# Patient Record
Sex: Female | Born: 1980 | Race: White | Hispanic: No | Marital: Single | Smoking: Never smoker
Health system: Southern US, Community
[De-identification: ages and names within clinical notes are randomized; demographics above are authoritative.]

## PROBLEM LIST (undated history)

## (undated) DIAGNOSIS — S92401A Displaced unspecified fracture of right great toe, initial encounter for closed fracture: Secondary | ICD-10-CM

## (undated) DIAGNOSIS — E039 Hypothyroidism, unspecified: Secondary | ICD-10-CM

## (undated) HISTORY — DX: Displaced unspecified fracture of right great toe, initial encounter for closed fracture: S92.401A

## (undated) HISTORY — DX: Hypothyroidism, unspecified: E03.9

---

## 2010-09-24 ENCOUNTER — Observation Stay (HOSPITAL_COMMUNITY)
Admission: EM | Admit: 2010-09-24 | Discharge: 2010-09-25 | Payer: Self-pay | Source: Home / Self Care | Attending: Orthopedic Surgery | Admitting: Orthopedic Surgery

## 2010-09-25 ENCOUNTER — Encounter: Payer: Self-pay | Admitting: Orthopedic Surgery

## 2010-10-04 LAB — DIFFERENTIAL
Basophils Absolute: 0 10*3/uL (ref 0.0–0.1)
Basophils Absolute: 0 10*3/uL (ref 0.0–0.1)
Basophils Relative: 0 % (ref 0–1)
Basophils Relative: 0 % (ref 0–1)
Eosinophils Absolute: 0.1 10*3/uL (ref 0.0–0.7)
Eosinophils Absolute: 0 10*3/uL (ref 0.0–0.7)
Eosinophils Relative: 0 % (ref 0–5)
Eosinophils Relative: 0 % (ref 0–5)
Lymphocytes Relative: 9 % — ABNORMAL LOW (ref 12–46)
Lymphocytes Relative: 14 % (ref 12–46)
Lymphs Abs: 1.4 10*3/uL (ref 0.7–4.0)
Lymphs Abs: 1.6 10*3/uL (ref 0.7–4.0)
Monocytes Absolute: 0.8 10*3/uL (ref 0.1–1.0)
Monocytes Absolute: 0.6 10*3/uL (ref 0.1–1.0)
Monocytes Relative: 5 % (ref 3–12)
Monocytes Relative: 5 % (ref 3–12)
Neutro Abs: 14.1 10*3/uL — ABNORMAL HIGH (ref 1.7–7.7)
Neutro Abs: 9.2 10*3/uL — ABNORMAL HIGH (ref 1.7–7.7)
Neutrophils Relative %: 86 % — ABNORMAL HIGH (ref 43–77)
Neutrophils Relative %: 81 % — ABNORMAL HIGH (ref 43–77)

## 2010-10-04 LAB — COMPREHENSIVE METABOLIC PANEL
ALT: 60 U/L — ABNORMAL HIGH (ref 0–35)
AST: 56 U/L — ABNORMAL HIGH (ref 0–37)
Albumin: 3.9 g/dL (ref 3.5–5.2)
Alkaline Phosphatase: 40 U/L (ref 39–117)
BUN: 11 mg/dL (ref 6–23)
CO2: 27 mEq/L (ref 19–32)
Calcium: 8.9 mg/dL (ref 8.4–10.5)
Chloride: 99 mEq/L (ref 96–112)
Creatinine, Ser: 0.68 mg/dL (ref 0.4–1.2)
GFR calc Af Amer: >60 mL/min (ref >60)
GFR calc non Af Amer: >60 mL/min (ref >60)
Glucose, Bld: 116 mg/dL — ABNORMAL HIGH (ref 70–99)
Potassium: 4.5 mEq/L (ref 3.5–5.1)
Sodium: 134 mEq/L — ABNORMAL LOW (ref 135–145)
Total Bilirubin: 0.8 mg/dL (ref 0.3–1.2)
Total Protein: 6.5 g/dL (ref 6.0–8.3)

## 2010-10-04 LAB — CBC
HCT: 38.2 % (ref 36.0–46.0)
HCT: 34.7 % — ABNORMAL LOW (ref 36.0–46.0)
Hemoglobin: 13.9 g/dL (ref 12.0–15.0)
Hemoglobin: 12.3 g/dL (ref 12.0–15.0)
MCH: 32.2 pg (ref 26.0–34.0)
MCH: 31.8 pg (ref 26.0–34.0)
MCHC: 36.4 g/dL — ABNORMAL HIGH (ref 30.0–36.0)
MCHC: 35.4 g/dL (ref 30.0–36.0)
MCV: 88.4 fL (ref 78.0–100.0)
MCV: 89.7 fL (ref 78.0–100.0)
Platelets: 248 10*3/uL (ref 150–400)
Platelets: 231 10*3/uL (ref 150–400)
RBC: 4.32 MIL/uL (ref 3.87–5.11)
RBC: 3.87 MIL/uL (ref 3.87–5.11)
RDW: 13.2 % (ref 11.5–15.5)
RDW: 13.5 % (ref 11.5–15.5)
WBC: 16.4 10*3/uL — ABNORMAL HIGH (ref 4.0–10.5)
WBC: 11.4 10*3/uL — ABNORMAL HIGH (ref 4.0–10.5)

## 2010-10-04 LAB — BASIC METABOLIC PANEL
BUN: 16 mg/dL (ref 6–23)
CO2: 27 mEq/L (ref 19–32)
Calcium: 9.6 mg/dL (ref 8.4–10.5)
Chloride: 100 mEq/L (ref 96–112)
Creatinine, Ser: 0.75 mg/dL (ref 0.4–1.2)
GFR calc Af Amer: >60 mL/min (ref >60)
GFR calc non Af Amer: >60 mL/min (ref >60)
Glucose, Bld: 138 mg/dL — ABNORMAL HIGH (ref 70–99)
Potassium: 4.1 mEq/L (ref 3.5–5.1)
Sodium: 136 mEq/L (ref 135–145)

## 2010-10-14 NOTE — Consult Note (Addendum)
  [name], [name]              ACCOUNT NO.:  [id number]  MEDICAL RECORD NO.:  [id number]          PATIENT TYPE:  OBV  LOCATION:  A310                          FACILITY:  [facility]  PHYSICIAN:  Vickki Hearing, M.D.DATE OF BIRTH:  [birth year]  DATE OF CONSULTATION: DATE OF DISCHARGE:  09/25/2010                                CONSULTATION   REASON FOR CONSULTATION:  Right rib fracture, #4 and 5.  HISTORY:  As noted in the medical record.  The patient basically is 30 years old, fell off a horse, had a small pneumothorax, fractured fourth and fifth rib, had a chest x-ray and CT scan which showed minimal pneumothorax, had a followup chest x-ray which showed no increase in the pneumothorax.  PHYSICAL EXAMINATION:  GENERAL:  Well-developed, well-nourished female, grooming, hygiene normal, slightly large body habitus. VITAL SIGNS:  Stable. HEENT:  She had some tenderness along the medial border of the scapula on the right. CHEST:  No chest wall tenderness anteriorly. NEUROLOGIC:  No neurovascular disturbance. SKIN:  Intact. NECK:  No lymphadenopathy in the cervical area. EXTREMITIES:  Seem to be functioning fine in terms of range of motion, strength, stability and alignment.  RADIOGRAPH:  As described  The patient will follow up with [place] in 2 weeks.  She is on Vicodin for pain.  She is advised not to do any strenuous activity for 6 weeks.     Vickki Hearing, M.D.     SEH/MEDQ  D:  09/25/2010  T:  09/26/2010  Job:  [id number]  cc:   Barbaraann Barthel, M.D. Fax: [phone]  Electronically Signed by Fuller Canada M.D. on 10/14/2010 12:44:56 PM

## 2010-10-18 ENCOUNTER — Ambulatory Visit
Admission: RE | Admit: 2010-10-18 | Discharge: 2010-10-18 | Payer: Self-pay | Source: Home / Self Care | Attending: Orthopedic Surgery | Admitting: Orthopedic Surgery

## 2010-10-18 DIAGNOSIS — S2249XA Multiple fractures of ribs, unspecified side, initial encounter for closed fracture: Secondary | ICD-10-CM | POA: Insufficient documentation

## 2010-10-21 NOTE — Consult Note (Signed)
Summary: Hospital consult RT rib fx  Hospital consult RT rib fx   Imported By: Cammie Sickle 10/01/2010 16:18:09  _____________________________________________________________________  External Attachment:    Type:   Image     Comment:   External Document

## 2010-10-21 NOTE — Letter (Signed)
Summary: Discharge Summary  Discharge Summary   Imported By: Cammie Sickle 10/05/2010 20:16:38  _____________________________________________________________________  External Attachment:    Type:   Image     Comment:   External Document

## 2010-10-25 NOTE — Discharge Summary (Signed)
  Annette Kelly, Annette Kelly              ACCOUNT NO.:  [id number]  MEDICAL RECORD NO.:  [id number]          PATIENT TYPE:  OBV  LOCATION:  A310                          FACILITY:  [facility]  PHYSICIAN:  Barbaraann Barthel, M.D. DATE OF BIRTH:  Apr 14, 1981  DATE OF ADMISSION:  09/24/2010 DATE OF DISCHARGE:  01/07/2012LH                              DISCHARGE SUMMARY   DIAGNOSIS:  Blunt trauma with right pneumothorax and fractured right fourth and fifth ribs.  NOTE:  This is a 30 year old white female who was riding a horse and fell off of her horse and came into the emergency room when she had chest pain.  She was seen and found to have a very minimal less than 5% pneumothorax on the right side.  She had fractured right fourth and fifth ribs.  Her CT scans of her abdomen, pelvis and chest were otherwise unremarkable.  The patient has remained hemodynamically stable and without any focalizing changes.  Her lab work has also remained stable.  Her white count is 11.4 with an H and H of 12.3 and 34.7 and her metabolic 7 was within normal limits.  She was observed overnight, obtained an orthopedic consult.  We will follow up with her and see if perhaps she might need some rib blocks for analgesia later on or other followup in that regard.  She is being discharged with the TENS unit and on Vicodin for pain and she is told to come to the emergency room if there are any acute respiratory changes.  Other discharge instructions include that she is definitely to do no physical activity for 6-8 weeks and no driving, no sexual activity, no lifting greater than 5 pounds. Otherwise, no restrictions as to diet and she is told to increase her activity slowly.  I will follow up with her in 2 week's time along with the orthopedics service and again she is very compliant patient and we will go to the emergency room should there be any acute changes.     Barbaraann Barthel, M.D.     WB/MEDQ  D:  09/25/2010   T:  09/26/2010  Job:  [id number]  cc:   Vickki Hearing, M.D. Fax: [phone]  Electronically Signed by Barbaraann Barthel M.D. on 10/25/2010 11:09:32 AM

## 2010-10-25 NOTE — Consult Note (Signed)
NAMESAJA, BARTOLINI              ACCOUNT NO.:  [id number]  MEDICAL RECORD NO.:  [id number]          PATIENT TYPE:  OBV  LOCATION:  A310                          FACILITY:  [facility]  PHYSICIAN:  Barbaraann Barthel, M.D. DATE OF BIRTH:  [birth year]  DATE OF CONSULTATION:  09/24/2010 DATE OF DISCHARGE:                                CONSULTATION   NOTE:  Surgery was asked to see this 30 year old white female who fell from her horse this evening.  She works as a Paediatric nurse, and she was wearing a helmet, and she was thrown from her horse.  There was no loss of consciousness, however, when she fell the wind knocked out of her, and she had some chest pain and came to the emergency room where she was noted to have fractures of her fourth and fifth rib on CT scan.  These were not seen by me on chest x-ray.  However, she had fractures of her fourth and fifth rib noted, but I did not see any pneumothorax on the plain chest film; however, she had a minimal 5% pneumothorax seen on CT scan.  No other abnormalities were encountered there, minimal contusion. Abdomen and pelvis CT is completely normal.  The patient is very stable except when she tries to move her right side.  She has had no hypotensive episodes or any other abnormalities noted.  PHYSICAL EXAMINATION:  VITAL SIGNS:  She has a temperature of 97.8.  Her blood pressure is 104/61.  Her pulse rate is 80, respirations 16, and she has had O2 sats of 96-100% on room air.  She is 5 feet 4 inches. Weighs 200 pounds. HEENT:  Head:  Normocephalic.  Eyes:  Extraocular movements are intact. Pupils were round and react to light and accommodation.  Nose and oral mucosa:  Moist. NECK:  Cylindrical.  There is no neck pain.  No jugular vein distention, thyromegaly, or tracheal deviation or bruits auscultated. CHEST:  The patient is tender over the fourth and fifth ribs on the right side.  Her lungs are clear, however. HEART:  Rate is regular without  murmurs. ABDOMEN:  Completely soft.  Bowel sounds are normoactive.  The patient has no femoral or inguinal hernias. EXTREMITIES:  She is moving all extremities well. RECTAL:  I did not do rectal examination on this patient, as she is rather sore to move and there is clinically absolutely no abdominal pain.  LABORATORY DATA:  CT scan of chest and abdomen and pelvis as mentioned above.  Her white count is 16,000 and 16.4 with an H and H of 13.9 and 38.2, platelet count is 248,000.  There is 86% neutrophiles.  Her metabolic parameters within normal limits.  BUN is 16 with a creatinine of 0.75, blood sugar is 138.  Urinalysis is pending.  REVIEW OF SYSTEMS:  CARDIORESPIRATORY SYSTEM:  As mentioned above, 5% pneumothorax with some contusion of the right lung and fractures of the fourth and fifth ribs.  She is a nonsmoker.  ENDOCRINE SYSTEM:  No history of diabetes or thyroid disease.  GU SYSTEM:  No history of dysuria or nephrolithiasis.  NEURO SYSTEM:  Intact.  OB/GYN HISTORY:  She has irregular menses.  Her last menstrual period was approximately 30 days ago, and she has no family history of carcinoma of the breast.  She is a gravida 0, para 0, abortus 0 female who is trying to obtain pregnancy with her husband.  IMPRESSION:  Therefore, contusions of right lung with fracture of the fourth and fifth ribs, minimal right pneumothorax.  The patient has no other localizing symptoms.  PLAN:  This patient will be admitted.  We will obtain a chest x-ray in the morning.  Obtain an Orthopedic consult in the morning as well.  We will control her pain with PCA pump tonight, and I will admit her and observe her and should she require a chest tube, I will place it here.     Barbaraann Barthel, M.D.     WB/MEDQ  D:  09/24/2010  T:  09/25/2010  Job:  [id number]  Electronically Signed by Barbaraann Barthel M.D. on 10/25/2010 11:09:25 AM

## 2010-10-27 NOTE — Assessment & Plan Note (Signed)
Summary: HOSP FOL/UP/FX RT RIB#4,5 PER DR HARRISON CONSULT/BCBS/CAF   Visit Type:  Follow-up  CC:  right rib fracture.  History of Present Illness: I saw Annette Kelly in the office today for a followup visit.  She is a 29 years old woman with the complaint of:  right rib fracture, 4 and 5  CONSULT FOLLOW UP   DOI 09-24-10.  Xrays done at [facility] on 09-25-10.  Medications: none.  Complaints: She is feeling much better.  NO BREATHING ISSUES   Allergies (verified): No Known Drug Allergies   Impression & Recommendations:  Problem # 1:  CLOSED FRACTURE OF TWO RIBS (ICD-807.02) Assessment Improved  SEEMS TO BE DOING VERY WELL. GRADUALLY RETURNING TO NORMAL ACTIVITY.   Orders: Est. Patient Level II (16109)  Patient Instructions: 1)  Gradually return to normal activity  2)  use pain as a guide  3)  If any breathing problems occur call [phone]  4)  Please schedule a follow-up appointment as needed.   Orders Added: 1)  Est. Patient Level II [60454]

## 2012-07-12 IMAGING — CR DG CHEST 2V
2 series · 2 of 2 positions shown · non-contrast
Comparison: Chest x-ray and chest CT dated 09/24/2010

CLINICAL DATA: Right pneumothorax.  The right rib fractures.

CHEST - 2 VIEW

[view not recorded (1 of 2)]
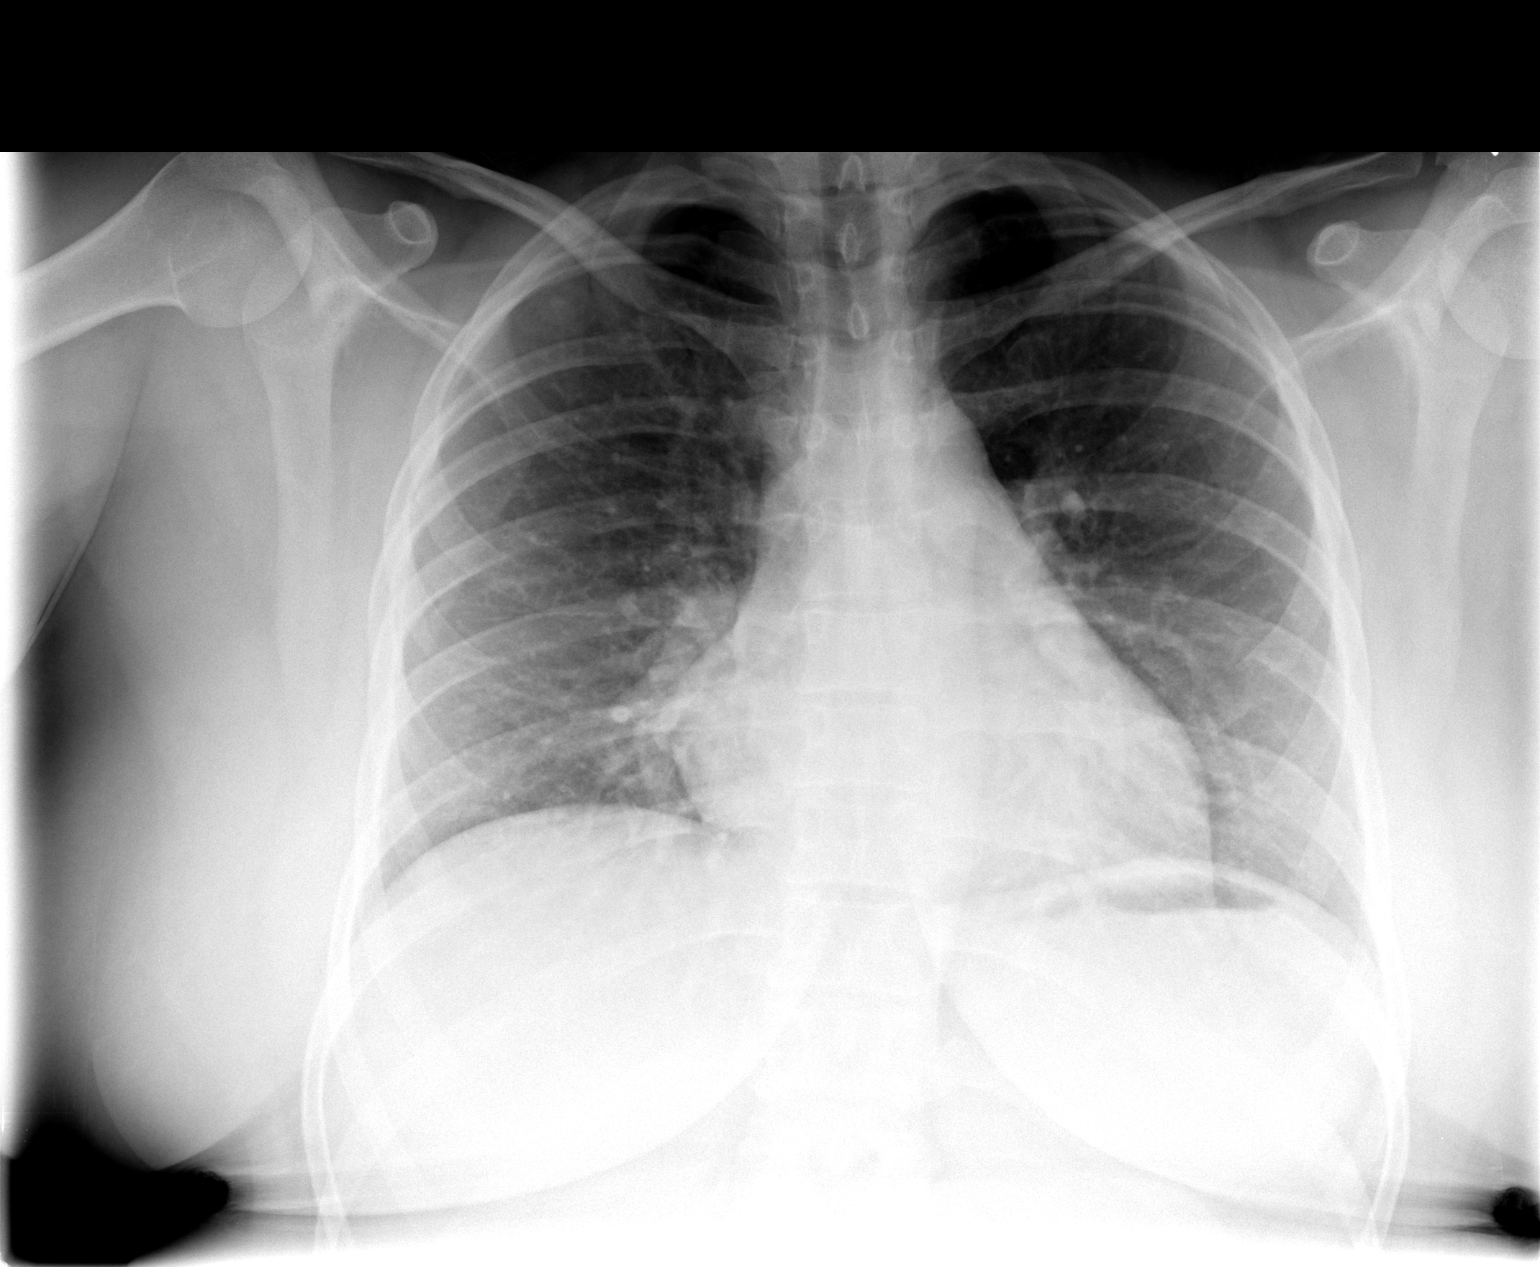

[view not recorded (2 of 2)]
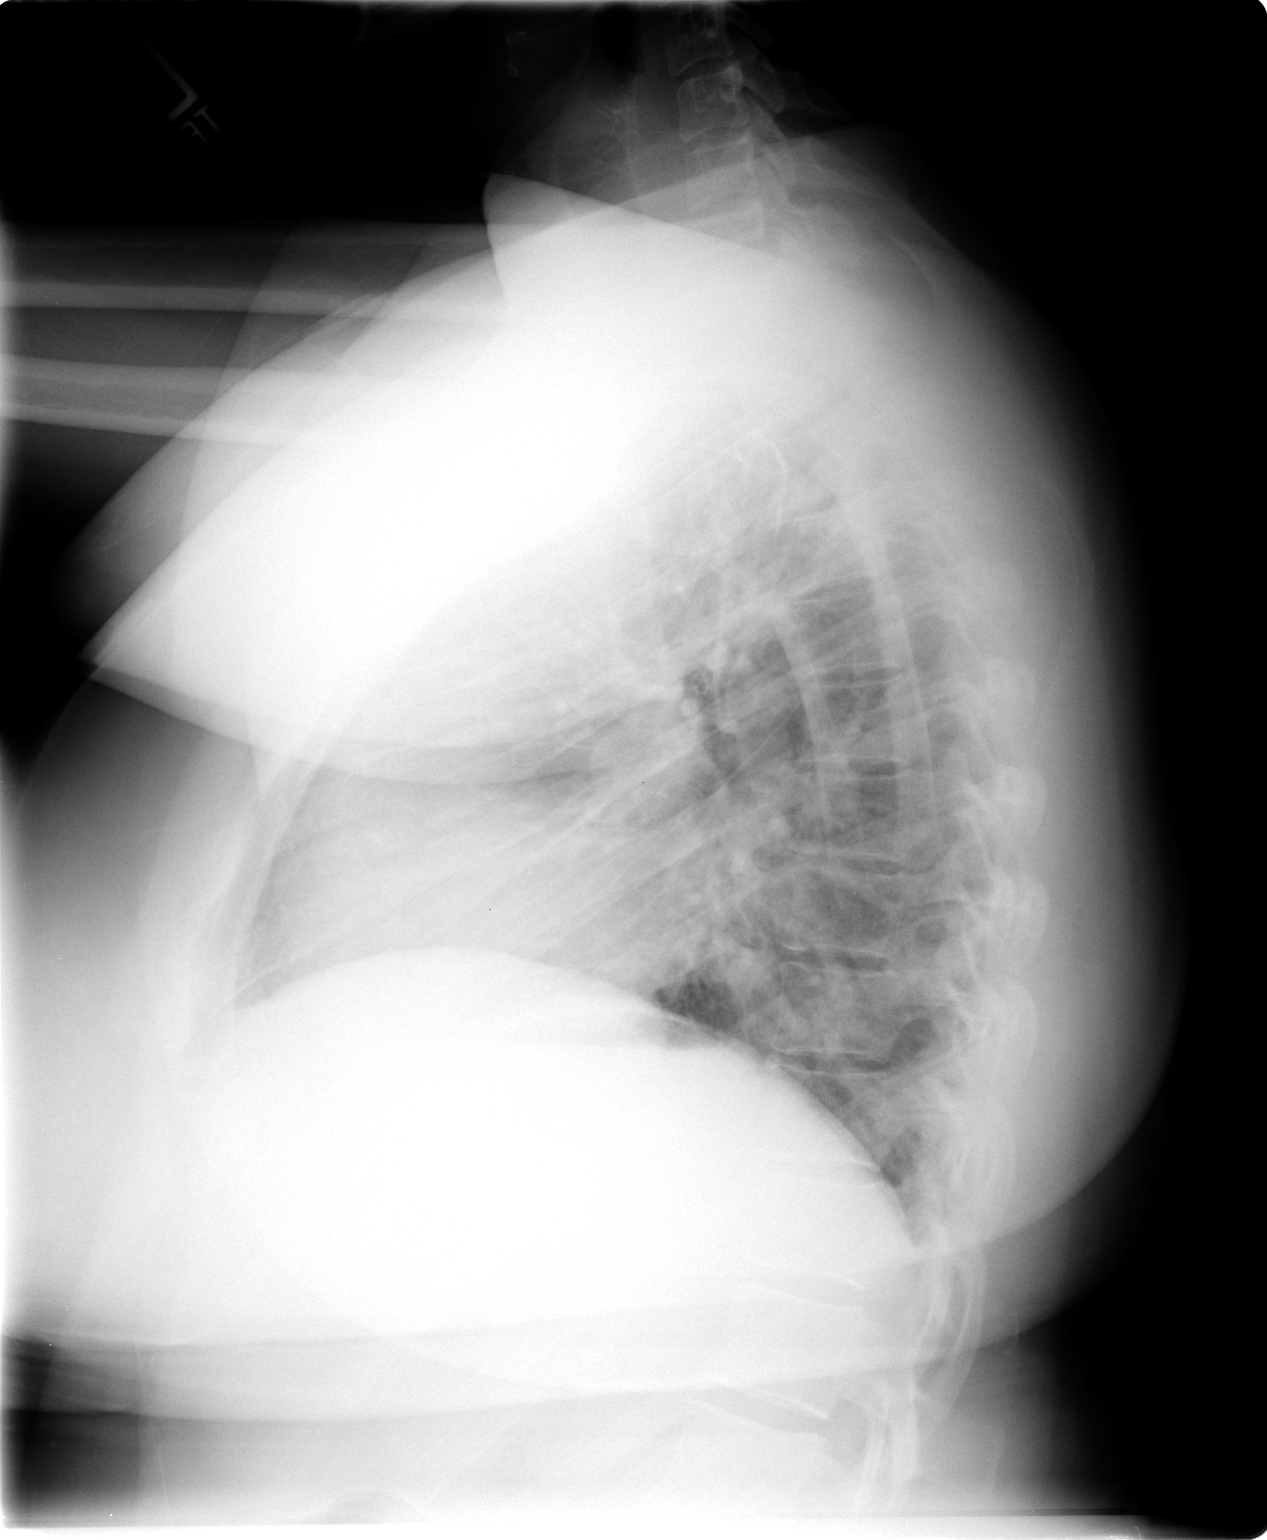

[2 of 2 positions shown; findings below may reference images not displayed]

FINDINGS: There is a tiny right apical pneumothorax.  The displaced
fracture of the posterior aspect of the right fourth rib is noted.

Heart size and vascularity are normal.  Lungs are clear.  No
effusions.
IMPRESSION: 1.  Tiny right apical pneumothorax.
2.  Slightly displaced fracture of the posterior aspect of the
right fourth rib.

## 2020-12-09 ENCOUNTER — Other Ambulatory Visit (INDEPENDENT_AMBULATORY_CARE_PROVIDER_SITE_OTHER): Payer: Self-pay | Admitting: Internal Medicine

## 2020-12-15 ENCOUNTER — Other Ambulatory Visit: Payer: Self-pay

## 2020-12-15 ENCOUNTER — Ambulatory Visit (INDEPENDENT_AMBULATORY_CARE_PROVIDER_SITE_OTHER): Payer: BC Managed Care – PPO | Admitting: Internal Medicine

## 2020-12-15 ENCOUNTER — Encounter (INDEPENDENT_AMBULATORY_CARE_PROVIDER_SITE_OTHER): Payer: Self-pay | Admitting: Internal Medicine

## 2020-12-15 ENCOUNTER — Other Ambulatory Visit (INDEPENDENT_AMBULATORY_CARE_PROVIDER_SITE_OTHER): Payer: Self-pay | Admitting: Internal Medicine

## 2020-12-15 VITALS — BP 128/82 | HR 78 | Temp 97.1°F | Ht 63.5 in | Wt 229.8 lb

## 2020-12-15 DIAGNOSIS — R200 Anesthesia of skin: Secondary | ICD-10-CM

## 2020-12-15 DIAGNOSIS — Z23 Encounter for immunization: Secondary | ICD-10-CM | POA: Diagnosis not present

## 2020-12-15 DIAGNOSIS — Z13220 Encounter for screening for lipoid disorders: Secondary | ICD-10-CM | POA: Diagnosis not present

## 2020-12-15 DIAGNOSIS — R208 Other disturbances of skin sensation: Secondary | ICD-10-CM | POA: Diagnosis not present

## 2020-12-15 DIAGNOSIS — Z131 Encounter for screening for diabetes mellitus: Secondary | ICD-10-CM

## 2020-12-15 DIAGNOSIS — R5383 Other fatigue: Secondary | ICD-10-CM

## 2020-12-15 DIAGNOSIS — Z1159 Encounter for screening for other viral diseases: Secondary | ICD-10-CM

## 2020-12-15 DIAGNOSIS — R5381 Other malaise: Secondary | ICD-10-CM

## 2020-12-15 DIAGNOSIS — E559 Vitamin D deficiency, unspecified: Secondary | ICD-10-CM

## 2020-12-15 DIAGNOSIS — E282 Polycystic ovarian syndrome: Secondary | ICD-10-CM | POA: Diagnosis not present

## 2020-12-15 DIAGNOSIS — M79605 Pain in left leg: Secondary | ICD-10-CM

## 2020-12-15 NOTE — Addendum Note (Signed)
Addended by: Ronita Hipps on: 12/15/2020 10:44 AM   Modules accepted: Orders

## 2020-12-15 NOTE — Progress Notes (Signed)
Metrics: Intervention Frequency ACO  Documented Smoking Status Yearly  Screened one or more times in 24 months  Cessation Counseling or  Active cessation medication Past 24 months  Past 24 months   Guideline developer: UpToDate (See UpToDate for funding source) Date Released: 2014       Wellness Office Visit  Subjective:  Patient ID: Annette Kelly, female    DOB: [birth year]  Age: 40 y.o. MRN: [id number]  CC: This 40 year old lady comes to our practice as a new patient to establish care. HPI  Her main complaint appears to be left hip pain which seems to radiate down the left leg and she has left foot numbness.  The symptoms have been present since August 2021.  She denies any trigger that cause the symptoms to appear as such as injury or heavy lifting.  She has been to a chiropractor which seem to help.  She takes Robaxin which seems to help.  She is concerned about the numbness in the left foot however which seems to be getting worse. She also describes fatigue. She also tells me that at one time in the past she was diagnosed with PCOS.  She does have a history of irregular menstrual cycles but no history of miscarriage, hirsutism or acne.  She has had difficulty in losing weight and this is one of the things she would like to accomplish by seeing me. In the past, she told she had low testosterone levels.  She tried testosterone for a while which seem to help. History reviewed. No pertinent past medical history. History reviewed. No pertinent surgical history.   Family History  Problem Relation Age of Onset  . Hypothyroidism Mother   . Hyperthyroidism Father   . 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   Social History   Tobacco Use  . Smoking status: Never Smoker  . Smokeless tobacco: Never Used  Substance Use Topics  . Alcohol use: Yes     Comment: occ    Current Meds  Medication Sig  . levonorgestrel (KYLEENA) 19.5 MG IUD by Intrauterine route once.  . methocarbamol (ROBAXIN) 500 MG tablet Take 1,000 mg by mouth as needed.     Flowsheet Row Office Visit from 12/15/2020 in [facility]  PHQ-9 Total Score 0      Objective:   Today's Vitals: BP 128/82   Pulse 78   Temp (!) 97.1 F (36.2 C) (Temporal)   Ht 5' 3.5" (1.613 m)   Wt 229 lb 12.8 oz (104.2 kg)   SpO2 99%   BMI 40.07 kg/m  Vitals with BMI 12/15/2020  Height 5' 3.5"  Weight 229 lbs 13 oz  BMI 40.06  Systolic 128  Diastolic 82  Pulse 78     Physical Exam  She is morbidly obese.  Blood pressure acceptable.     Assessment   1. Malaise and fatigue   2. Morbid obesity (HCC)   3. Pain of left lower extremity   4. Numbness of left foot   5. PCOS (polycystic ovarian syndrome)   6. Encounter for hepatitis C screening test for low risk patient   7. Vitamin D deficiency disease   8. Screening for diabetes mellitus   9. Screening for lipoid disorders       Tests ordered Orders Placed This Encounter  Procedures  . Hepatitis C antibody  . Follicle stimulating hormone  .  Luteinizing hormone  . Testos,Total,Free and SHBG (Female)  . T3, free  . T4, free  . TSH  . Lipid panel  . CBC  . COMPLETE METABOLIC PANEL WITH GFR  . VITAMIN D 25 Hydroxy (Vit-D Deficiency, Fractures)  . Hemoglobin A1c  . DHEA-sulfate  . Ambulatory referral to Neurology     Plan: 1. Regarding her left leg pain and left foot numbness, I will send her to neurology for further evaluation.  I suspect she will need MRI lumbar spine. 2. We will do all the blood work for work-up of PCOS. 3. Other blood work is ordered for general health purposes. 4. I will see her in the next several weeks to discuss all blood results and then give further recommendations. 5. Tdap booster was given today.   No orders of the defined types were placed in this  encounter.   Wilson Singer, MD

## 2020-12-16 LAB — LIPID PANEL
Cholesterol: 211 mg/dL — ABNORMAL HIGH (ref <200)
LDL Cholesterol (Calc): 125 mg/dL (calc) — ABNORMAL HIGH
Total CHOL/HDL Ratio: 3.1 (calc) (ref <5.0)
Triglycerides: 73 mg/dL (ref <150)

## 2020-12-16 LAB — COMPLETE METABOLIC PANEL WITH GFR
AG Ratio: 1.8 (calc) (ref 1.0–2.5)
Albumin: 4.4 g/dL (ref 3.6–5.1)
CO2: 27 mmol/L (ref 20–32)
Creat: 0.57 mg/dL (ref 0.50–1.10)
Glucose, Bld: 106 mg/dL — ABNORMAL HIGH (ref 65–99)

## 2020-12-16 LAB — CBC
HCT: 40.9 % (ref 35.0–45.0)
MPV: 10 fL (ref 7.5–12.5)
Platelets: 277 10*3/uL (ref 140–400)

## 2020-12-16 LAB — HEPATITIS C ANTIBODY
Hepatitis C Ab: NONREACTIVE
SIGNAL TO CUT-OFF: 0 (ref <1.00)

## 2020-12-16 LAB — T4, FREE: Free T4: 1.1 ng/dL (ref 0.8–1.8)

## 2020-12-16 LAB — DHEA-SULFATE: DHEA-SO4: 99 ug/dL (ref 15–205)

## 2020-12-16 LAB — TSH: TSH: 1.72 mIU/L

## 2020-12-18 LAB — HEMOGLOBIN A1C
Hgb A1c MFr Bld: 5.8 % of total Hgb — ABNORMAL HIGH (ref <5.7)
Mean Plasma Glucose: 120 mg/dL
eAG (mmol/L): 6.6 mmol/L

## 2020-12-18 LAB — VITAMIN D 25 HYDROXY (VIT D DEFICIENCY, FRACTURES): Vit D, 25-Hydroxy: 21 ng/mL — ABNORMAL LOW (ref 30–100)

## 2020-12-18 LAB — COMPLETE METABOLIC PANEL WITH GFR
ALT: 27 U/L (ref 6–29)
AST: 20 U/L (ref 10–30)
Alkaline phosphatase (APISO): 30 U/L — ABNORMAL LOW (ref 31–125)
BUN: 16 mg/dL (ref 7–25)
Calcium: 9.3 mg/dL (ref 8.6–10.2)
Chloride: 104 mmol/L (ref 98–110)
GFR, Est African American: 133 mL/min/{1.73_m2} (ref >=60)
GFR, Est Non African American: 115 mL/min/{1.73_m2} (ref >=60)
Globulin: 2.4 g/dL (calc) (ref 1.9–3.7)
Potassium: 4.1 mmol/L (ref 3.5–5.3)
Sodium: 138 mmol/L (ref 135–146)
Total Bilirubin: 0.4 mg/dL (ref 0.2–1.2)
Total Protein: 6.8 g/dL (ref 6.1–8.1)

## 2020-12-18 LAB — LIPID PANEL
HDL: 69 mg/dL (ref >=50)
Non-HDL Cholesterol (Calc): 142 mg/dL (calc) — ABNORMAL HIGH (ref <130)

## 2020-12-18 LAB — FOLLICLE STIMULATING HORMONE: FSH: 2.1 m[IU]/mL

## 2020-12-18 LAB — CBC
Hemoglobin: 13.4 g/dL (ref 11.7–15.5)
MCH: 31 pg (ref 27.0–33.0)
MCHC: 32.8 g/dL (ref 32.0–36.0)
MCV: 94.7 fL (ref 80.0–100.0)
RBC: 4.32 10*6/uL (ref 3.80–5.10)
RDW: 13 % (ref 11.0–15.0)
WBC: 8.1 10*3/uL (ref 3.8–10.8)

## 2020-12-18 LAB — TESTOS,TOTAL,FREE AND SHBG (FEMALE)
Free Testosterone: 3.1 pg/mL (ref 0.1–6.4)
Sex Hormone Binding: 41 nmol/L (ref 17–124)
Testosterone, Total, LC-MS-MS: 24 ng/dL (ref 2–45)

## 2020-12-18 LAB — T3, FREE: T3, Free: 3.2 pg/mL (ref 2.3–4.2)

## 2020-12-18 LAB — LUTEINIZING HORMONE: LH: 3.2 m[IU]/mL

## 2021-01-25 ENCOUNTER — Ambulatory Visit (INDEPENDENT_AMBULATORY_CARE_PROVIDER_SITE_OTHER): Payer: BC Managed Care – PPO | Admitting: Internal Medicine

## 2021-01-28 ENCOUNTER — Encounter (INDEPENDENT_AMBULATORY_CARE_PROVIDER_SITE_OTHER): Payer: Self-pay | Admitting: Internal Medicine

## 2021-01-28 ENCOUNTER — Other Ambulatory Visit: Payer: Self-pay

## 2021-01-28 ENCOUNTER — Ambulatory Visit (INDEPENDENT_AMBULATORY_CARE_PROVIDER_SITE_OTHER): Payer: BC Managed Care – PPO | Admitting: Internal Medicine

## 2021-01-28 VITALS — BP 140/82 | HR 83 | Temp 97.8°F | Resp 18 | Ht 64.0 in | Wt 230.0 lb

## 2021-01-28 DIAGNOSIS — E559 Vitamin D deficiency, unspecified: Secondary | ICD-10-CM

## 2021-01-28 DIAGNOSIS — R7303 Prediabetes: Secondary | ICD-10-CM | POA: Diagnosis not present

## 2021-01-28 DIAGNOSIS — E282 Polycystic ovarian syndrome: Secondary | ICD-10-CM | POA: Diagnosis not present

## 2021-01-28 MED ORDER — NP THYROID 60 MG PO TABS: 60.0000 mg | ORAL_TABLET | Freq: Every day | ORAL | 3 refills | Status: DC

## 2021-01-28 NOTE — Progress Notes (Signed)
Metrics: Intervention Frequency ACO  Documented Smoking Status Yearly  Screened one or more times in 24 months  Cessation Counseling or  Active cessation medication Past 24 months  Past 24 months   Guideline developer: UpToDate (See UpToDate for funding source) Date Released: 2014       Wellness Office Visit  Subjective:  Patient ID: Annette Kelly, female    DOB: October 31, 1980  Age: 40 y.o. MRN: [id number]  CC: This pleasant lady comes in to discuss all her blood work and further recommendations. HPI  It appears that the leg pain and numbness that she was having is better but not completely gone.  Somehow she did not get a neurology appointment and we will follow-up on this. We discussed all her results and her blood work does seem to confirm the presence of PCOS.  She also has vitamin D deficiency.  She is also prediabetic with a hemoglobin A1c of 5.8%. She has hypercholesterolemia also. Free T3 levels are somewhat suboptimal and she does have some symptoms consistent with thyroid hypofunction. History reviewed. No pertinent past medical history. History reviewed. No pertinent surgical history.   Family History  Problem Relation Age of Onset  . Hypothyroidism Mother   . Hyperthyroidism Father   . 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   Social History   Tobacco Use  . Smoking status: Never Smoker  . Smokeless tobacco: Never Used  Substance Use Topics  . Alcohol use: Yes    Comment: occ    Current Meds  Medication Sig  . levonorgestrel (KYLEENA) 19.5 MG IUD by Intrauterine route once.  . NP THYROID 60 MG tablet Take 1 tablet (60 mg total) by mouth daily before breakfast.     Flowsheet Row Office Visit from 01/28/2021 in [facility]  PHQ-9 Total Score 1      Objective:   Today's Vitals: BP 140/82 (BP Location: Right  Arm, Patient Position: Sitting, Cuff Size: Normal)   Pulse 83   Temp 97.8 F (36.6 C) (Temporal)   Resp 18   Ht 5\' 4"  (1.626 m)   Wt 230 lb (104.3 kg)   SpO2 98%   BMI 39.48 kg/m  Vitals with BMI 01/28/2021 12/15/2020  Height 5\' 4"  5' 3.5"  Weight 230 lbs 229 lbs 13 oz  BMI 39.46 40.06  Systolic 140 128  Diastolic 82 82  Pulse 83 78     Physical Exam   She remains morbidly obese.  No other new physical findings.   Assessment   1. PCOS (polycystic ovarian syndrome)   2. Prediabetes   3. Vitamin D deficiency disease   4. Morbid obesity (HCC)       Tests ordered No orders of the defined types were placed in this encounter.    Plan: 1. I discussed the pathophysiology of PCOS in detail. 2. We discussed nutrition at length with the concept of intermittent fasting combined with a plant-based diet, ensuring adequate hydration.  She will try to do this. 3. I recommended she start taking vitamin D3 10,000 units daily. 4. We discussed her thyroid tests and I think she does have symptoms of thyroid hypofunction and she is willing to have a trial of thyroid to see if this will help her, especially since it would reduce insulin resistance.  Therefore, I have started her on NP thyroid 60 mg tablet take 1 daily and  I explained possible side effects. 5. Follow-up in about 4 to 6 weeks time to see how she is doing.   Meds ordered this encounter  Medications  . NP THYROID 60 MG tablet    Sig: Take 1 tablet (60 mg total) by mouth daily before breakfast.    Dispense:  30 tablet    Refill:  3    Jasdeep Dejarnett Normajean Glasgow, MD

## 2021-01-28 NOTE — Patient Instructions (Signed)
[name] Dietary Recommendations for Weight Loss What to Avoid . Avoid added sugars o Often added sugar can be found in processed foods such as many condiments, dry cereals, cakes, cookies, chips, crisps, crackers, candies, sweetened drinks, etc.  o Read labels and AVOID/DECREASE use of foods with the following in their ingredient list: Sugar, fructose, high fructose corn syrup, sucrose, glucose, maltose, dextrose, molasses, cane sugar, brown sugar, any type of syrup, agave nectar, etc.   . Avoid snacking in between meals . Avoid foods made with flour o If you are going to eat food made with flour, choose those made with whole-grains; and, minimize your consumption as much as is tolerable . Avoid processed foods o These foods are generally stocked in the middle of the grocery store. Focus on shopping on the perimeter of the grocery.  . Avoid Meat  o We recommend following a plant-based diet at [facility]. Thus, we recommend avoiding meat as a general rule. Consider eating beans, legumes, eggs, and/or dairy products for regular protein sources o If you plan on eating meat limit to 4 ounces of meat at a time and choose lean options such as Fish, chicken, turkey. Avoid red meat intake such as pork and/or steak What to Include . Vegetables o GREEN LEAFY VEGETABLES: Kale, spinach, mustard greens, collard greens, cabbage, broccoli, etc. o OTHER: Asparagus, cauliflower, eggplant, carrots, peas, Brussel sprouts, tomatoes, bell peppers, zucchini, beets, cucumbers, etc. . Grains, seeds, and legumes o Beans: kidney beans, black eyed peas, garbanzo beans, black beans, pinto beans, etc. o Whole, unrefined grains: brown rice, barley, bulgur, oatmeal, etc. . Healthy fats  o Avoid highly processed fats such as vegetable oil o Examples of healthy fats: avocado, olives, virgin olive oil, dark chocolate (?72% Cocoa), nuts (peanuts, almonds, walnuts, cashews, pecans, etc.) . None to Low  Intake of Animal Sources of Protein o Meat sources: chicken, turkey, salmon, tuna. Limit to 4 ounces of meat at one time. o Consider limiting dairy sources, but when choosing dairy focus on: PLAIN Greek yogurt, cottage cheese, high-protein milk . Fruit o Choose berries  When to Eat . Intermittent Fasting: o Choosing not to eat for a specific time period, but DO FOCUS ON HYDRATION when fasting o Multiple Techniques: - Time Restricted Eating: eat 3 meals in a day, each meal lasting no more than 60 minutes, no snacks between meals - 16-18 hour fast: fast for 16 to 18 hours up to 7 days a week. Often suggested to start with 2-3 nonconsecutive days per week.  . Remember the time you sleep is counted as fasting.  . Examples of eating schedule: Fast from 7:00pm-11:00am. Eat between 11:00am-7:00pm.  - 24-hour fast: fast for 24 hours up to every other day. Often suggested to start with 1 day per week . Remember the time you sleep is counted as fasting . Examples of eating schedule:  o Eating day: eat 2-3 meals on your eating day. If doing 2 meals, each meal should last no more than 90 minutes. If doing 3 meals, each meal should last no more than 60 minutes. Finish last meal by 7:00pm. o Fasting day: Fast until 7:00pm.  o IF YOU FEEL UNWELL FOR ANY REASON/IN ANY WAY WHEN FASTING, STOP FASTING BY EATING A NUTRITIOUS SNACK OR LIGHT MEAL o ALWAYS FOCUS ON HYDRATION DURING FASTS - Acceptable Hydration sources: water, broths, tea/coffee (black tea/coffee is best but using a small amount of whole-fat dairy products in coffee/tea is acceptable).  -   Poor Hydration Sources: anything with sugar or artificial sweeteners added to it  These recommendations have been developed for patients that are actively receiving medical care from either Dr. Maisley Hainsworth or Sarah Gray, DNP, NP-C at [facility]. These recommendations are developed for patients with specific medical conditions and are not meant to be  distributed or used by others that are not actively receiving care from either provider listed above at [facility]. It is not appropriate to participate in the above eating plans without proper medical supervision.   Reference: Fung, J. The obesity code. [place]: [place]; 2016.   

## 2021-03-11 ENCOUNTER — Encounter (INDEPENDENT_AMBULATORY_CARE_PROVIDER_SITE_OTHER): Payer: Self-pay | Admitting: Internal Medicine

## 2021-03-11 ENCOUNTER — Other Ambulatory Visit: Payer: Self-pay

## 2021-03-11 ENCOUNTER — Ambulatory Visit (INDEPENDENT_AMBULATORY_CARE_PROVIDER_SITE_OTHER): Payer: BC Managed Care – PPO | Admitting: Internal Medicine

## 2021-03-11 VITALS — BP 146/81 | HR 89 | Temp 98.4°F | Resp 18 | Ht 64.0 in | Wt 233.0 lb

## 2021-03-11 DIAGNOSIS — E559 Vitamin D deficiency, unspecified: Secondary | ICD-10-CM

## 2021-03-11 DIAGNOSIS — R5383 Other fatigue: Secondary | ICD-10-CM

## 2021-03-11 DIAGNOSIS — R7303 Prediabetes: Secondary | ICD-10-CM | POA: Diagnosis not present

## 2021-03-11 DIAGNOSIS — E282 Polycystic ovarian syndrome: Secondary | ICD-10-CM

## 2021-03-11 DIAGNOSIS — R5381 Other malaise: Secondary | ICD-10-CM | POA: Diagnosis not present

## 2021-03-11 LAB — TSH: TSH: 1.21 mIU/L

## 2021-03-11 LAB — COMPLETE METABOLIC PANEL WITH GFR
AG Ratio: 1.8 (calc) (ref 1.0–2.5)
ALT: 27 U/L (ref 6–29)
AST: 23 U/L (ref 10–30)
Albumin: 4.3 g/dL (ref 3.6–5.1)
Alkaline phosphatase (APISO): 41 U/L (ref 31–125)
BUN: 15 mg/dL (ref 7–25)
CO2: 26 mmol/L (ref 20–32)
Calcium: 9.3 mg/dL (ref 8.6–10.2)
Chloride: 103 mmol/L (ref 98–110)
Creat: 0.74 mg/dL (ref 0.50–1.10)
GFR, Est African American: 116 mL/min/{1.73_m2} (ref >=60)
GFR, Est Non African American: 100 mL/min/{1.73_m2} (ref >=60)
Globulin: 2.4 g/dL (calc) (ref 1.9–3.7)
Glucose, Bld: 100 mg/dL (ref 65–139)
Potassium: 3.9 mmol/L (ref 3.5–5.3)
Sodium: 139 mmol/L (ref 135–146)
Total Bilirubin: 0.5 mg/dL (ref 0.2–1.2)
Total Protein: 6.7 g/dL (ref 6.1–8.1)

## 2021-03-11 LAB — VITAMIN D 25 HYDROXY (VIT D DEFICIENCY, FRACTURES): Vit D, 25-Hydroxy: 44 ng/mL (ref 30–100)

## 2021-03-11 LAB — T3, FREE: T3, Free: 2.7 pg/mL (ref 2.3–4.2)

## 2021-03-11 NOTE — Progress Notes (Signed)
Metrics: Intervention Frequency ACO  Documented Smoking Status Yearly  Screened one or more times in 24 months  Cessation Counseling or  Active cessation medication Past 24 months  Past 24 months   Guideline developer: UpToDate (See UpToDate for funding source) Date Released: 2014       Wellness Office Visit  Subjective:  Patient ID: Annette Kelly, female    DOB: Aug 20, 1981  Age: 40 y.o. MRN: [id number]  CC: This lady comes in for follow-up of her morbid obesity, PCOS, fatigue and prediabetes. HPI  In terms of nutrition, she did well with intermittent fasting for the first couple of weeks but then she had some for shows and this was somewhat jeopardized but she is back now on following nutritional guidance that we discussed before. In terms of her energy, she does feel that she has more energy now since start of taking NP thyroid.  She has had no adverse side effects. She has been taking vitamin D3 10,000 units daily also. History reviewed. No pertinent past medical history. History reviewed. No pertinent surgical history.   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   Social History   Tobacco Use   Smoking status: Never   Smokeless tobacco: Never  Substance Use Topics   Alcohol use: Yes    Comment: occ    Current Meds  Medication Sig   levonorgestrel (KYLEENA) 19.5 MG IUD by Intrauterine route once.   methocarbamol (ROBAXIN) 500 MG tablet Take 1,000 mg by mouth as needed.   NP THYROID 60 MG tablet Take 1 tablet (60 mg total) by mouth daily before breakfast.     Flowsheet Row Office Visit from 01/28/2021 in [facility]  PHQ-9 Total Score 1       Objective:   Today's Vitals: BP (!) 146/81 (BP Location: Right Arm, Patient  Position: Sitting, Cuff Size: Large)   Pulse 89   Temp 98.4 F (36.9 C) (Temporal)   Resp 18   Ht 5\' 4"  (1.626 m)   Wt 233 lb (105.7 kg)   SpO2 96%   BMI 39.99 kg/m  Vitals with BMI 03/11/2021 01/28/2021 12/15/2020  Height 5\' 4"  5\' 4"  5' 3.5"  Weight 233 lbs 230 lbs 229 lbs 13 oz  BMI 39.97 39.46 40.06  Systolic 146 140 12/17/2020  Diastolic 81 82 82  Pulse 89 83 78     Physical Exam   Although she remains obese and has gained some weight, she does feel better.    Assessment   1. PCOS (polycystic ovarian syndrome)   2. Malaise and fatigue   3. Prediabetes   4. Vitamin D deficiency disease       Tests ordered Orders Placed This Encounter  Procedures   COMPLETE METABOLIC PANEL WITH GFR   VITAMIN D 25 Hydroxy (Vit-D Deficiency, Fractures)   TSH   T3, free      Plan: 1.  Continue with nutritional plan that we discussed previously. 2.  Continue with NP thyroid 60 mg daily and we will check thyroid function today.  I suspect we will need to optimize further. 3.  Continue with vitamin D3 10,000 units daily and I will check levels today. 4.  I will see her in the next 6 to 8 weeks to see  how she is doing.    No orders of the defined types were placed in this encounter.   Wilson Singer, MD

## 2021-03-11 NOTE — Progress Notes (Signed)
Was on intermitted fasting, now she been in horse show competitions. So she was not well at that time to continue. So she ate healthy just not fasted during that time.

## 2021-03-15 ENCOUNTER — Other Ambulatory Visit (INDEPENDENT_AMBULATORY_CARE_PROVIDER_SITE_OTHER): Payer: Self-pay | Admitting: Internal Medicine

## 2021-03-15 MED ORDER — NP THYROID 60 MG PO TABS: 60.0000 mg | ORAL_TABLET | Freq: Two times a day (BID) | ORAL | 3 refills | Status: AC

## 2021-04-10 ENCOUNTER — Other Ambulatory Visit (INDEPENDENT_AMBULATORY_CARE_PROVIDER_SITE_OTHER): Payer: Self-pay | Admitting: Internal Medicine

## 2021-04-10 ENCOUNTER — Encounter (INDEPENDENT_AMBULATORY_CARE_PROVIDER_SITE_OTHER): Payer: Self-pay | Admitting: Internal Medicine

## 2021-04-10 DIAGNOSIS — R200 Anesthesia of skin: Secondary | ICD-10-CM

## 2021-04-10 DIAGNOSIS — M79605 Pain in left leg: Secondary | ICD-10-CM

## 2021-04-10 DIAGNOSIS — R208 Other disturbances of skin sensation: Secondary | ICD-10-CM

## 2021-04-14 ENCOUNTER — Encounter: Payer: Self-pay | Admitting: Neurology

## 2021-04-14 ENCOUNTER — Telehealth: Payer: Self-pay | Admitting: Neurology

## 2021-04-14 ENCOUNTER — Ambulatory Visit (INDEPENDENT_AMBULATORY_CARE_PROVIDER_SITE_OTHER): Payer: BC Managed Care – PPO | Admitting: Neurology

## 2021-04-14 ENCOUNTER — Other Ambulatory Visit: Payer: Self-pay

## 2021-04-14 VITALS — BP 138/83 | HR 79 | Ht 64.0 in | Wt 230.8 lb

## 2021-04-14 DIAGNOSIS — M5416 Radiculopathy, lumbar region: Secondary | ICD-10-CM | POA: Diagnosis not present

## 2021-04-14 DIAGNOSIS — M79605 Pain in left leg: Secondary | ICD-10-CM

## 2021-04-14 DIAGNOSIS — R200 Anesthesia of skin: Secondary | ICD-10-CM

## 2021-04-14 MED ORDER — METHYLPREDNISOLONE 4 MG PO TBPK: ORAL_TABLET | ORAL | 0 refills | Status: AC

## 2021-04-14 MED ORDER — PREGABALIN 75 MG PO CAPS: 75.0000 mg | ORAL_CAPSULE | Freq: Two times a day (BID) | ORAL | 1 refills | Status: AC

## 2021-04-14 NOTE — Telephone Encounter (Signed)
MRI lumbar spine w/wo contrast [place] Numbers: [id number] (04/14/21-05/13/21)  Sent to GI for scheduling

## 2021-04-14 NOTE — Progress Notes (Signed)
[facility] [address] [place] [address] [phone]       OFFICE CONSULT NOTE  [name] Date of Birth:  Jul 01, 1981 Medical Record Number:  [id number]   Referring MD: Annette Kelly  Reason for Referral: Left leg pain and numbness  HPI: Annette Kelly is a 40 year old pleasant Caucasian lady seen today for initial office consultation visit.  History is obtained from the patient and review of referral notes.  No pertinent imaging films available in PACS for review.  She has past medical history of hypothyroidism and obesity only.  She states for the last year or so she has had intermittent left leg pain and numbness.  This is usually brought on by bending down to or twisting movements.  She describes a sharp shooting electric current leg pain starting in the left buttock and going down the back of the left thigh and lateral aspect of left foot up to the big toe.  She is also noticed numbness on the lateral aspect of the left leg now for several days.  These episodes occur intermittently she has learned to be very careful the way she bends down.  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  She has been to chiropractor multiple occasions but now finally when the pain has become more frequent and numbness constant she is decided to seek further evaluation.  She is never had any x-rays of her back or MRI scan or EMG nerve conduction study done.  She has a TENS unit which she has tried using her low back and left thigh which seems to help.  Taking Tylenol or over-the-counter medication has not helped.  She has not tried medications like prednisone or gabapentin, Topamax or Lyrica.  She denies any recent injury or fall.  ROS:   14 system review of systems is positive for left leg pain, numbness, cramps and all other systems negative  PMH:  Past Medical History:  Diagnosis Date   Bilateral great toe fractures    x 3 toes  right foot   Hypothyroid     Social History:  Social History   Socioeconomic History   Marital status: Single    Spouse name: Not on file   Number of children: 0   Years of education: Not on file   Highest education level: Bachelor's degree (e.g., BA, AB, BS)  Occupational History   Not on file  Tobacco Use   Smoking status: Never   Smokeless tobacco: Never  Vaping Use   Vaping Use: Never used  Substance and Sexual Activity   Alcohol use: Yes    Comment: occ   Drug use: Never   Sexual activity: Not on file  Other Topics Concern   Not on file  Social History Narrative   Single,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   College educated -Scientist, clinical (histocompatibility and immunogenetics).   Social Determinants of Health   Financial Resource Strain: Not on file  Food Insecurity: Not on file  Transportation Needs: Not on file  Physical Activity: Not on file  Stress: Not on file  Social Connections: Not on file  Intimate Partner Violence: Not on file    Medications:   Current Outpatient Medications on File Prior to Visit  Medication Sig Dispense Refill   Cholecalciferol (D3 PO) Take by mouth. Dose unknown     ibuprofen (ADVIL) 200 MG tablet 200 mg as needed.     levonorgestrel (KYLEENA) 19.5  MG IUD by Intrauterine route once.     methocarbamol (ROBAXIN) 500 MG tablet Take 1,000 mg by mouth as needed.     NP THYROID 60 MG tablet Take 1 tablet (60 mg total) by mouth 2 (two) times daily. 60 tablet 3   No current facility-administered medications on file prior to visit.    Allergies:   Allergies  Allergen Reactions   Morphine And Related     Vomits and gets extremely sick    Physical Exam General: Obese middle-aged Caucasian lady, seated, in no evident distress Head: head normocephalic and atraumatic.   Neck: supple with no carotid or supraclavicular bruits Cardiovascular: regular rate and rhythm, no murmurs Musculoskeletal: no deformity .straight leg raising test is  negative.  No significant spasm of the lumbosacral muscles. Skin:  no rash/petichiae Vascular:  Normal pulses all extremities  Neurologic Exam Mental Status: Awake and fully alert. Oriented to place and time. Recent and remote memory intact. Attention span, concentration and fund of knowledge appropriate. Mood and affect appropriate.  Cranial Nerves: Fundoscopic exam reveals sharp disc margins. Pupils equal, briskly reactive to light. Extraocular movements full without nystagmus. Visual fields full to confrontation. Hearing intact. Facial sensation intact. Face, tongue, palate moves normally and symmetrically.  Motor: Normal bulk and tone. Normal strength in all tested extremity muscles. Sensory.:  Diminished touch pinprick sensation on the lateral aspect of the left thigh, left leg and dorsum of the left foot.  Diminished vibration on the left second toe. Coordination: Rapid alternating movements normal in all extremities. Finger-to-nose and heel-to-shin performed accurately bilaterally. Gait and Station: Arises from chair without difficulty. Stance is normal. Gait demonstrates normal stride length and balance . Able to heel, toe and tandem walk without difficulty.  Reflexes: 1+ and symmetric. Toes downgoing.      ASSESSMENT: 40 year old Caucasian lady with intermittent left leg pain and numbness likely due to lumbar left L5 radiculopathy from degenerative spine disease.     PLAN: I had a long discussion with the patient regarding her chronic intermittent left leg pain and numbness which I think is due to lumbar radiculopathy from degenerative spine disease.  I recommend further evaluation by checking MRI scan of the lumbar spine to look for compressive lesion as well as EMG nerve conduction study.  Trial of Medrol Dosepak as well as Lyrica to provide relief from her neuropathic pain.  She was advised to avoid rapid.  Repetitive bending activities and when she feels better to do back  strengthening exercises.  She will return for follow-up in 2 months or call earlier if necessary. Greater than 50% time during this 45-minute consultation visit was spent on counseling and coordination of care about her lumbar radiculopathy and discussion about plan for evaluation and treatment and answering questions.  Delia Heady, MD Note: This document was prepared with digital dictation and possible smart phrase technology. Any transcriptional errors that result from this process are unintentional.

## 2021-04-14 NOTE — Patient Instructions (Signed)
I had a long discussion with the patient regarding her chronic intermittent left leg pain and numbness which I think is due to lumbar radiculopathy from degenerative spine disease.  I recommend further evaluation by checking MRI scan of the lumbar spine to look for compressive lesion as well as EMG nerve conduction study.  Trial of Medrol Dosepak as well as Lyrica to provide relief from her neuropathic pain.  She was advised to avoid rapid.  Repetitive bending activities and when she feels better to do back strengthening exercises.  She will return for follow-up in 2 months or call earlier if necessary. Lumbosacral Radiculopathy Lumbosacral radiculopathy is a condition that involves the spinal nerves and nerve roots in the low back and bottom of the spine. The condition develops when these nerves and nerve roots move out of place or become inflamed andcause symptoms. What are the causes? This condition may be caused by: Pressure from a disk that bulges out of place (herniated disk). A disk is a plate of soft cartilage that separates bones in the spine. Disk changes that occur with age (disk degeneration). A narrowing of the bones of the lower back (spinal stenosis). A tumor. An infection. An injury that places sudden pressure on the disks that cushion the bones of your lower spine. What increases the risk? You are more likely to develop this condition if: You are a female who is 22-40 years old. You are a female who is 61-40 years old. You use improper technique when lifting things. You are overweight or live a sedentary lifestyle. You smoke. Your work requires frequent lifting. You do repetitive activities that strain the spine. What are the signs or symptoms? Symptoms of this condition include: Pain that goes down from your back into your legs (sciatica), usually on one side of the body. This is the most common symptom. The pain may be worse with sitting, coughing, or sneezing. Pain and  numbness in your legs. Muscle weakness. Tingling. Loss of bladder control or bowel control. How is this diagnosed? This condition may be diagnosed based on: Your symptoms and medical history. A physical exam. If the pain is lasting, you may have tests, such as: MRI scan. X-ray. CT scan. A type of X-ray used to examine the spinal canal after injecting a dye into your spine (myelogram). A test to measure how electrical impulses move through a nerve (nerve conduction study). How is this treated? Treatment may depend on the cause of the condition and may include: Working with a physical therapist. Taking pain medicine. Applying heat and ice to affected areas. Doing stretches to improve flexibility. Doing exercises to strengthen back muscles. Having chiropractic spinal manipulation. Using transcutaneous electrical nerve stimulation (TENS) therapy. Getting a steroid injection in the spine. In some cases, no treatment is needed. If the condition is long-lasting (chronic), or if symptoms are severe, treatment may involve surgery or lifestylechanges, such as following a weight-loss plan. Follow these instructions at home: Activity Avoid bending and other activities that make the problem worse. Maintain a proper position when standing or sitting: When standing, keep your upper back and neck straight, with your shoulders pulled back. Avoid slouching. When sitting, keep your back straight and relax your shoulders. Do not round your shoulders or pull them backward. Do not sit or stand in one place for long periods of time. Take brief periods of rest throughout the day. This will reduce your pain. It is usually better to rest by lying down or standing, not sitting.  When you are resting for longer periods, mix in some mild activity or stretching between periods of rest. This will help to prevent stiffness and pain. Get regular exercise. Ask your health care provider what activities are safe for  you. If you were shown how to do any exercises or stretches, do them as directed by your health care provider. Do not lift anything that is heavier than 10 lb (4.5 kg) or the limit that you are told by your health care provider. Always use proper lifting technique, which includes: Bending your knees. Keeping the load close to your body. Avoiding twisting. Managing pain If directed, put ice on the affected area: Put ice in a plastic bag. Place a towel between your skin and the bag. Leave the ice on for 20 minutes, 2-3 times a day. If directed, apply heat to the affected area as often as told by your health care provider. Use the heat source that your health care provider recommends, such as a moist heat pack or a heating pad. Place a towel between your skin and the heat source. Leave the heat on for 20-30 minutes. Remove the heat if your skin turns bright red. This is especially important if you are unable to feel pain, heat, or cold. You may have a greater risk of getting burned. Take over-the-counter and prescription medicines only as told by your health care provider. General instructions Sleep on a firm mattress in a comfortable position. Try lying on your side with your knees slightly bent. If you lie on your back, put a pillow under your knees. Do not drive or use heavy machinery while taking prescription pain medicine. If your health care provider prescribed a diet or exercise program, follow it as directed. Keep all follow-up visits as told by your health care provider. This is important. Contact a health care provider if: Your pain does not improve over time, even when taking pain medicines. Get help right away if: You develop severe pain. Your pain suddenly gets worse. You develop increasing weakness in your legs. You lose the ability to control your bladder or bowel. You have difficulty walking or balancing. You have a fever. Summary Lumbosacral radiculopathy is a condition  that occurs when the spinal nerves and nerve roots in the lower part of the spine move out of place or become inflamed and cause symptoms. Symptoms include pain, numbness, and tingling that go down from your back into your legs (sciatica), muscle weakness, and loss of bladder control or bowel control. If directed, apply ice or heat to the affected area as told by your health care provider. Follow instructions about activity, rest, and proper lifting technique. This information is not intended to replace advice given to you by your health care provider. Make sure you discuss any questions you have with your healthcare provider. Document Revised: 07/16/2020 Document Reviewed: 07/16/2020 Elsevier Patient Education  2022 [employer].

## 2021-04-23 ENCOUNTER — Ambulatory Visit
Admission: RE | Admit: 2021-04-23 | Discharge: 2021-04-23 | Disposition: A | Discharge status: Home / Self Care | Payer: BC Managed Care – PPO | Source: Ambulatory Visit | Attending: Neurology | Admitting: Neurology

## 2021-04-23 ENCOUNTER — Other Ambulatory Visit: Payer: Self-pay

## 2021-04-23 DIAGNOSIS — M5416 Radiculopathy, lumbar region: Secondary | ICD-10-CM | POA: Diagnosis not present

## 2021-04-23 MED ORDER — GADOBENATE DIMEGLUMINE 529 MG/ML IV SOLN
20.0000 mL | Freq: Once | INTRAVENOUS | Status: AC | PRN
  Administered 2021-04-23: 20 mL via INTRAVENOUS

## 2021-04-28 ENCOUNTER — Telehealth: Payer: Self-pay | Admitting: Neurology

## 2021-04-28 NOTE — Telephone Encounter (Signed)
Please call patient, MRI of the lumbar spine showed large left paramedian disc extrusion at L5-S1, causing moderate foraminal narrowing, evidence of left S1 nerve root compression, might also affecting left L5  Above findings would explain her left lumbar radiculopathy, left leg pain   She is on schedule for EMG nerve conduction study May 06, 2021, please check her pain, on low-dose Lyrica 75 twice daily, if needed, may get higher dose   IMPRESSION: This MRI of the lumbar spine with and without contrast shows the following: 1.   At L5-S1, there is a large left paramedian disc extrusion during the lateral recess and also causing moderate foraminal narrowing.  There is left S1 nerve root compression.  The disc herniation might also affect the left L5 nerve root. 2.   At L4-L5, there are degenerative changes with prominent joint effusions and a right synovial cyst posteriorly.  There is mild to moderate foraminal narrowing and moderate left lateral recess stenosis but no nerve root compression. 3.   Normal enhancement pattern.

## 2021-04-28 NOTE — Telephone Encounter (Signed)
I called the patient and provided her with the MRI results. States her pain has improved w/ the Lyrica 75mg  BID and stretching. She is able to sit and drive some without so much discomfort. She will keep her pending NCV/EMG appt.

## 2021-05-06 ENCOUNTER — Ambulatory Visit (INDEPENDENT_AMBULATORY_CARE_PROVIDER_SITE_OTHER): Payer: BC Managed Care – PPO | Admitting: Diagnostic Neuroimaging

## 2021-05-06 ENCOUNTER — Encounter (INDEPENDENT_AMBULATORY_CARE_PROVIDER_SITE_OTHER): Payer: BC Managed Care – PPO | Admitting: Diagnostic Neuroimaging

## 2021-05-06 ENCOUNTER — Other Ambulatory Visit: Payer: Self-pay

## 2021-05-06 DIAGNOSIS — Z0289 Encounter for other administrative examinations: Secondary | ICD-10-CM

## 2021-05-06 DIAGNOSIS — M5416 Radiculopathy, lumbar region: Secondary | ICD-10-CM

## 2021-05-11 NOTE — Procedures (Signed)
[facility]  NCS (NERVE CONDUCTION STUDY) WITH EMG (ELECTROMYOGRAPHY) REPORT   STUDY DATE: 05/06/21 PATIENT NAME: Annette Kelly DOB: 1981/06/09 MRN: [id number]  ORDERING CLINICIAN: Delia Heady, MD   TECHNOLOGIST: Durenda Age ELECTROMYOGRAPHER: Glenford Bayley. Jiaire Rosebrook, MD  CLINICAL INFORMATION: 39 year old female with low back pain and leg pain.  FINDINGS: NERVE CONDUCTION STUDY:  Bilateral peroneal and tibial motor responses are normal.  Bilateral tibial F wave latencies are normal.  Bilateral sural and superficial peroneal sensory responses are normal.    NEEDLE ELECTROMYOGRAPHY:   Needle examination of left lower extremity and left lumbar paraspinal muscles are normal.   IMPRESSION:   Normal study.  No electrodiagnostic evidence of large fiber neuropathy or lumbar radiculopathy this time.    INTERPRETING PHYSICIAN:  Suanne Marker, MD Certified in Neurology, Neurophysiology and Neuroimaging  [facility] [address] [place], [place] [address] [phone]   [facility]    Nerve / Sites Muscle Latency Ref. Amplitude Ref. Rel Amp Segments Distance Velocity Ref. Area    ms ms mV mV %  cm m/s m/s mVms  R Peroneal - EDB     Ankle EDB 4.2 ?6.5 6.1 ?2.0 100 Ankle - EDB 9   19.9     Fib head EDB 9.1  5.8  95.6 Fib head - Ankle 27 54 ?44 19.3     Pop fossa EDB 11.3  5.7  98.2 Pop fossa - Fib head 10 47 ?44 19.2         Pop fossa - Ankle      L Peroneal - EDB     Ankle EDB 4.7 ?6.5 2.3 ?2.0 100 Ankle - EDB 9   6.2     Fib head EDB 10.4  2.1  90.2 Fib head - Ankle 27 47 ?44 5.8     Pop fossa EDB 12.5  2.3  108 Pop fossa - Fib head 10 47 ?44 6.2         Pop fossa - Ankle      R Tibial - AH     Ankle AH 3.1 ?5.8 4.4 ?4.0 100 Ankle - AH 9   9.0     Pop fossa AH 10.7  3.3  75 Pop fossa - Ankle 36 47 ?41 5.6  L Tibial - AH     Ankle AH 4.4 ?5.8 4.2 ?4.0 100 Ankle - AH 9   8.0     Pop fossa AH 12.7  2.9  70.5 Pop fossa -  Ankle 36 43 ?41 5.1             SNC    Nerve / Sites Rec. Site Peak Lat Ref.  Amp Ref. Segments Distance    ms ms V V  cm  R Sural - Ankle (Calf)     Calf Ankle 3.1 ?4.4 8 ?6 Calf - Ankle 14  L Sural - Ankle (Calf)     Calf Ankle 2.9 ?4.4 9 ?6 Calf - Ankle 14  R Superficial peroneal - Ankle     Lat leg Ankle 3.2 ?4.4 8 ?6 Lat leg - Ankle 14  L Superficial peroneal - Ankle     Lat leg Ankle 3.3 ?4.4 6 ?6 Lat leg - Ankle 14             F  Wave    Nerve F Lat Ref.   ms ms  R Tibial - AH 46.4 ?56.0  L Tibial - AH 47.2 ?  56.0         EMG Summary Table    Spontaneous MUAP Recruitment  Muscle IA Fib PSW Fasc Other Amp Dur. Poly Pattern  L. Vastus medialis Normal None None None _______ Normal Normal Normal Normal  L. Tibialis anterior Increased None None None _______ Normal Normal Normal Normal  L. Gastrocnemius (Medial head) Normal None None None _______ Normal Normal Normal Normal  L. Lumbar paraspinals (low) Normal None None None _______ Normal Normal Normal Normal

## 2021-05-12 ENCOUNTER — Other Ambulatory Visit: Payer: Self-pay | Admitting: Neurology

## 2021-05-12 ENCOUNTER — Telehealth: Payer: Self-pay | Admitting: Emergency Medicine

## 2021-05-12 DIAGNOSIS — M5126 Other intervertebral disc displacement, lumbar region: Secondary | ICD-10-CM

## 2021-05-12 NOTE — Telephone Encounter (Signed)
-----   Message from Micki Riley, MD sent at 05/12/2021  8:19 AM EDT ----- Joneen Roach inform the patient that nerve conduction EMG study was normal ----- Message ----- From: Suanne Marker, MD Sent: 05/11/2021  11:43 AM EDT To: Micki Riley, MD

## 2021-05-13 ENCOUNTER — Telehealth: Payer: Self-pay | Admitting: Neurology

## 2021-05-13 ENCOUNTER — Ambulatory Visit (INDEPENDENT_AMBULATORY_CARE_PROVIDER_SITE_OTHER): Payer: BC Managed Care – PPO | Admitting: Internal Medicine

## 2021-05-13 NOTE — Telephone Encounter (Signed)
Sent Referral to [facility] ph # [phone] & fax # [phone].

## 2021-05-14 DIAGNOSIS — R030 Elevated blood-pressure reading, without diagnosis of hypertension: Secondary | ICD-10-CM | POA: Diagnosis not present

## 2021-05-14 DIAGNOSIS — Z6841 Body Mass Index (BMI) 40.0 and over, adult: Secondary | ICD-10-CM | POA: Diagnosis not present

## 2021-05-14 DIAGNOSIS — M5127 Other intervertebral disc displacement, lumbosacral region: Secondary | ICD-10-CM | POA: Diagnosis not present

## 2021-06-22 ENCOUNTER — Ambulatory Visit: Payer: BC Managed Care – PPO | Admitting: Neurology
